# Patient Record
Sex: Male | Born: 1980 | Race: White | Hispanic: No | Smoking: Never smoker
Health system: Southern US, Community
[De-identification: ages and names within clinical notes are randomized; demographics above are authoritative.]

## PROBLEM LIST (undated history)

## (undated) HISTORY — PX: WISDOM TOOTH EXTRACTION: SHX21

---

## 2019-11-23 DIAGNOSIS — K219 Gastro-esophageal reflux disease without esophagitis: Secondary | ICD-10-CM | POA: Diagnosis not present

## 2019-11-23 DIAGNOSIS — R040 Epistaxis: Secondary | ICD-10-CM | POA: Diagnosis not present

## 2019-11-23 DIAGNOSIS — R042 Hemoptysis: Secondary | ICD-10-CM | POA: Diagnosis not present

## 2019-11-23 DIAGNOSIS — Z6836 Body mass index (BMI) 36.0-36.9, adult: Secondary | ICD-10-CM | POA: Diagnosis not present

## 2020-01-10 DIAGNOSIS — S76211A Strain of adductor muscle, fascia and tendon of right thigh, initial encounter: Secondary | ICD-10-CM | POA: Diagnosis not present

## 2020-01-10 DIAGNOSIS — Z6836 Body mass index (BMI) 36.0-36.9, adult: Secondary | ICD-10-CM | POA: Diagnosis not present

## 2020-01-17 DIAGNOSIS — X58XXXD Exposure to other specified factors, subsequent encounter: Secondary | ICD-10-CM | POA: Diagnosis not present

## 2020-01-17 DIAGNOSIS — M6281 Muscle weakness (generalized): Secondary | ICD-10-CM | POA: Diagnosis not present

## 2020-01-17 DIAGNOSIS — S76211D Strain of adductor muscle, fascia and tendon of right thigh, subsequent encounter: Secondary | ICD-10-CM | POA: Diagnosis not present

## 2020-01-17 DIAGNOSIS — M79604 Pain in right leg: Secondary | ICD-10-CM | POA: Diagnosis not present

## 2020-01-19 DIAGNOSIS — M79604 Pain in right leg: Secondary | ICD-10-CM | POA: Diagnosis not present

## 2020-01-19 DIAGNOSIS — M6281 Muscle weakness (generalized): Secondary | ICD-10-CM | POA: Diagnosis not present

## 2020-01-19 DIAGNOSIS — X58XXXD Exposure to other specified factors, subsequent encounter: Secondary | ICD-10-CM | POA: Diagnosis not present

## 2020-01-19 DIAGNOSIS — S76211D Strain of adductor muscle, fascia and tendon of right thigh, subsequent encounter: Secondary | ICD-10-CM | POA: Diagnosis not present

## 2020-01-24 DIAGNOSIS — X58XXXD Exposure to other specified factors, subsequent encounter: Secondary | ICD-10-CM | POA: Diagnosis not present

## 2020-01-24 DIAGNOSIS — M6281 Muscle weakness (generalized): Secondary | ICD-10-CM | POA: Diagnosis not present

## 2020-01-24 DIAGNOSIS — S76211D Strain of adductor muscle, fascia and tendon of right thigh, subsequent encounter: Secondary | ICD-10-CM | POA: Diagnosis not present

## 2020-01-24 DIAGNOSIS — M79604 Pain in right leg: Secondary | ICD-10-CM | POA: Diagnosis not present

## 2020-01-26 DIAGNOSIS — M6281 Muscle weakness (generalized): Secondary | ICD-10-CM | POA: Diagnosis not present

## 2020-01-26 DIAGNOSIS — X58XXXD Exposure to other specified factors, subsequent encounter: Secondary | ICD-10-CM | POA: Diagnosis not present

## 2020-01-26 DIAGNOSIS — M79604 Pain in right leg: Secondary | ICD-10-CM | POA: Diagnosis not present

## 2020-01-26 DIAGNOSIS — S76211D Strain of adductor muscle, fascia and tendon of right thigh, subsequent encounter: Secondary | ICD-10-CM | POA: Diagnosis not present

## 2020-01-31 DIAGNOSIS — X58XXXD Exposure to other specified factors, subsequent encounter: Secondary | ICD-10-CM | POA: Diagnosis not present

## 2020-01-31 DIAGNOSIS — M79604 Pain in right leg: Secondary | ICD-10-CM | POA: Diagnosis not present

## 2020-01-31 DIAGNOSIS — M6281 Muscle weakness (generalized): Secondary | ICD-10-CM | POA: Diagnosis not present

## 2020-01-31 DIAGNOSIS — S76211D Strain of adductor muscle, fascia and tendon of right thigh, subsequent encounter: Secondary | ICD-10-CM | POA: Diagnosis not present

## 2020-02-02 DIAGNOSIS — M79604 Pain in right leg: Secondary | ICD-10-CM | POA: Diagnosis not present

## 2020-02-02 DIAGNOSIS — S76211D Strain of adductor muscle, fascia and tendon of right thigh, subsequent encounter: Secondary | ICD-10-CM | POA: Diagnosis not present

## 2020-02-02 DIAGNOSIS — M6281 Muscle weakness (generalized): Secondary | ICD-10-CM | POA: Diagnosis not present

## 2020-02-02 DIAGNOSIS — X58XXXD Exposure to other specified factors, subsequent encounter: Secondary | ICD-10-CM | POA: Diagnosis not present

## 2020-02-04 DIAGNOSIS — S76011A Strain of muscle, fascia and tendon of right hip, initial encounter: Secondary | ICD-10-CM | POA: Diagnosis not present

## 2020-02-04 DIAGNOSIS — X58XXXA Exposure to other specified factors, initial encounter: Secondary | ICD-10-CM | POA: Diagnosis not present

## 2020-02-04 DIAGNOSIS — M545 Low back pain: Secondary | ICD-10-CM | POA: Diagnosis not present

## 2020-02-04 DIAGNOSIS — M79651 Pain in right thigh: Secondary | ICD-10-CM | POA: Diagnosis not present

## 2020-02-04 DIAGNOSIS — M25551 Pain in right hip: Secondary | ICD-10-CM | POA: Diagnosis not present

## 2020-02-07 DIAGNOSIS — M6281 Muscle weakness (generalized): Secondary | ICD-10-CM | POA: Diagnosis not present

## 2020-02-07 DIAGNOSIS — M79604 Pain in right leg: Secondary | ICD-10-CM | POA: Diagnosis not present

## 2020-02-07 DIAGNOSIS — S76211D Strain of adductor muscle, fascia and tendon of right thigh, subsequent encounter: Secondary | ICD-10-CM | POA: Diagnosis not present

## 2020-02-07 DIAGNOSIS — X58XXXD Exposure to other specified factors, subsequent encounter: Secondary | ICD-10-CM | POA: Diagnosis not present

## 2020-02-08 DIAGNOSIS — M545 Low back pain: Secondary | ICD-10-CM | POA: Diagnosis not present

## 2020-02-08 DIAGNOSIS — M4317 Spondylolisthesis, lumbosacral region: Secondary | ICD-10-CM | POA: Diagnosis not present

## 2020-02-08 DIAGNOSIS — M4726 Other spondylosis with radiculopathy, lumbar region: Secondary | ICD-10-CM | POA: Diagnosis not present

## 2020-02-15 DIAGNOSIS — M545 Low back pain: Secondary | ICD-10-CM | POA: Diagnosis not present

## 2020-02-15 DIAGNOSIS — M4316 Spondylolisthesis, lumbar region: Secondary | ICD-10-CM | POA: Diagnosis not present

## 2020-02-15 DIAGNOSIS — M4317 Spondylolisthesis, lumbosacral region: Secondary | ICD-10-CM | POA: Diagnosis not present

## 2020-02-15 DIAGNOSIS — M4807 Spinal stenosis, lumbosacral region: Secondary | ICD-10-CM | POA: Diagnosis not present

## 2020-02-22 DIAGNOSIS — I10 Essential (primary) hypertension: Secondary | ICD-10-CM | POA: Diagnosis not present

## 2020-02-22 DIAGNOSIS — M5136 Other intervertebral disc degeneration, lumbar region: Secondary | ICD-10-CM | POA: Diagnosis not present

## 2020-02-22 DIAGNOSIS — M5416 Radiculopathy, lumbar region: Secondary | ICD-10-CM | POA: Diagnosis not present

## 2020-02-22 DIAGNOSIS — M4317 Spondylolisthesis, lumbosacral region: Secondary | ICD-10-CM | POA: Diagnosis not present

## 2020-02-22 DIAGNOSIS — Z6837 Body mass index (BMI) 37.0-37.9, adult: Secondary | ICD-10-CM | POA: Diagnosis not present

## 2020-03-07 DIAGNOSIS — M5416 Radiculopathy, lumbar region: Secondary | ICD-10-CM | POA: Diagnosis not present

## 2020-03-07 DIAGNOSIS — M5136 Other intervertebral disc degeneration, lumbar region: Secondary | ICD-10-CM | POA: Diagnosis not present

## 2020-03-07 DIAGNOSIS — M4317 Spondylolisthesis, lumbosacral region: Secondary | ICD-10-CM | POA: Diagnosis not present

## 2020-03-21 DIAGNOSIS — M79661 Pain in right lower leg: Secondary | ICD-10-CM | POA: Diagnosis not present

## 2020-03-21 DIAGNOSIS — R102 Pelvic and perineal pain: Secondary | ICD-10-CM | POA: Diagnosis not present

## 2020-03-21 DIAGNOSIS — M79662 Pain in left lower leg: Secondary | ICD-10-CM | POA: Diagnosis not present

## 2020-03-21 DIAGNOSIS — R937 Abnormal findings on diagnostic imaging of other parts of musculoskeletal system: Secondary | ICD-10-CM | POA: Diagnosis not present

## 2020-03-21 DIAGNOSIS — M4317 Spondylolisthesis, lumbosacral region: Secondary | ICD-10-CM | POA: Diagnosis not present

## 2020-04-04 DIAGNOSIS — M5136 Other intervertebral disc degeneration, lumbar region: Secondary | ICD-10-CM | POA: Diagnosis not present

## 2020-04-20 DIAGNOSIS — M25551 Pain in right hip: Secondary | ICD-10-CM | POA: Diagnosis not present

## 2020-04-20 DIAGNOSIS — M5416 Radiculopathy, lumbar region: Secondary | ICD-10-CM | POA: Diagnosis not present

## 2020-04-20 DIAGNOSIS — M5136 Other intervertebral disc degeneration, lumbar region: Secondary | ICD-10-CM | POA: Diagnosis not present

## 2020-04-20 DIAGNOSIS — M545 Low back pain: Secondary | ICD-10-CM | POA: Diagnosis not present

## 2020-04-21 DIAGNOSIS — M5417 Radiculopathy, lumbosacral region: Secondary | ICD-10-CM | POA: Diagnosis not present

## 2020-04-21 DIAGNOSIS — R1031 Right lower quadrant pain: Secondary | ICD-10-CM | POA: Diagnosis not present

## 2020-04-21 DIAGNOSIS — Z6836 Body mass index (BMI) 36.0-36.9, adult: Secondary | ICD-10-CM | POA: Diagnosis not present

## 2020-04-26 DIAGNOSIS — M25551 Pain in right hip: Secondary | ICD-10-CM | POA: Diagnosis not present

## 2020-05-02 DIAGNOSIS — M5136 Other intervertebral disc degeneration, lumbar region: Secondary | ICD-10-CM | POA: Diagnosis not present

## 2020-05-09 DIAGNOSIS — R1031 Right lower quadrant pain: Secondary | ICD-10-CM | POA: Diagnosis not present

## 2020-05-23 ENCOUNTER — Other Ambulatory Visit: Payer: Self-pay | Admitting: Neurosurgery

## 2020-05-23 DIAGNOSIS — M4307 Spondylolysis, lumbosacral region: Secondary | ICD-10-CM | POA: Diagnosis not present

## 2020-06-08 ENCOUNTER — Other Ambulatory Visit: Payer: Self-pay | Admitting: Neurosurgery

## 2020-06-09 NOTE — Pre-Procedure Instructions (Signed)
Your procedure is scheduled on Wednesday, June 16th.  Report to [facility] Main Entrance "A" at 6:30 A.M., and check in at the Admitting office.  Call this number if you have problems the morning of surgery:  [phone]  Call [phone] if you have any questions prior to your surgery date Monday-Friday 8am-4pm    Remember:  Do not eat or drink after midnight the night before your surgery    Take these medicines the morning of surgery with A SIP OF WATER: NONE Albuterol inhaler-as needed for shortness of breath; please bring with you to the hospital.   As of today, STOP taking any Aspirin (unless otherwise instructed by your surgeon) and Aspirin containing products, Aleve, Naproxen, Ibuprofen, Motrin, Advil, Goody's, BC's, all herbal medications, fish oil, and all vitamins.                      Do not wear jewelry.            Do not wear lotions, powders, colognes, or deodorant.            Men may shave face and neck.            Do not bring valuables to the hospital.            [facility] is not responsible for any belongings or valuables.  Do NOT Smoke (Tobacco/Vaping) or drink Alcohol 24 hours prior to your procedure If you use a CPAP at night, you may bring all equipment for your overnight stay.   Contacts, glasses, dentures or bridgework may not be worn into surgery.      For patients admitted to the hospital, discharge time will be determined by your treatment team.   Patients discharged the day of surgery will not be allowed to drive home, and someone needs to stay with them for 24 hours.    Special instructions:   [place]- Preparing For Surgery  Before surgery, you can play an important role. Because skin is not sterile, your skin needs to be as free of germs as possible. You can reduce the number of germs on your skin by washing with CHG (chlorahexidine gluconate) Soap before surgery.  CHG is an antiseptic cleaner which kills germs and bonds with the skin to  continue killing germs even after washing.    Oral Hygiene is also important to reduce your risk of infection.  Remember - BRUSH YOUR TEETH THE MORNING OF SURGERY WITH YOUR REGULAR TOOTHPASTE  Please do not use if you have an allergy to CHG or antibacterial soaps. If your skin becomes reddened/irritated stop using the CHG.  Do not shave (including legs and underarms) for at least 48 hours prior to first CHG shower. It is OK to shave your face.  Please follow these instructions carefully.   1. Shower the NIGHT BEFORE SURGERY and the MORNING OF SURGERY with CHG Soap.   2. If you chose to wash your hair, wash your hair first as usual with your normal shampoo.  3. After you shampoo, rinse your hair and body thoroughly to remove the shampoo.  4. Use CHG as you would any other liquid soap. You can apply CHG directly to the skin and wash gently with a scrungie or a clean washcloth.   5. Apply the CHG Soap to your body ONLY FROM THE NECK DOWN.  Do not use on open wounds or open sores. Avoid contact with your eyes, ears, mouth and genitals (  private parts). Wash Face and genitals (private parts)  with your normal soap.   6. Wash thoroughly, paying special attention to the area where your surgery will be performed.  7. Thoroughly rinse your body with warm water from the neck down.  8. DO NOT shower/wash with your normal soap after using and rinsing off the CHG Soap.  9. Pat yourself dry with a CLEAN TOWEL.  10. Wear CLEAN PAJAMAS to bed the night before surgery, wear comfortable clothes the morning of surgery  11. Place CLEAN SHEETS on your bed the night of your first shower and DO NOT SLEEP WITH PETS.   Day of Surgery:   Do not apply any deodorants/lotions.  Please wear clean clothes to the hospital/surgery center.   Remember to brush your teeth WITH YOUR REGULAR TOOTHPASTE.   Please read over the following fact sheets that you were given.

## 2020-06-12 ENCOUNTER — Other Ambulatory Visit: Payer: Self-pay

## 2020-06-12 ENCOUNTER — Encounter (HOSPITAL_COMMUNITY): Payer: Self-pay

## 2020-06-12 ENCOUNTER — Encounter (HOSPITAL_COMMUNITY)
Admission: RE | Admit: 2020-06-12 | Discharge: 2020-06-12 | Disposition: A | Discharge status: Home / Self Care | Payer: BC Managed Care – PPO | Source: Ambulatory Visit | Attending: Neurosurgery | Admitting: Neurosurgery

## 2020-06-12 ENCOUNTER — Other Ambulatory Visit (HOSPITAL_COMMUNITY)
Admission: RE | Admit: 2020-06-12 | Discharge: 2020-06-12 | Disposition: A | Discharge status: Home / Self Care | Payer: BC Managed Care – PPO | Source: Ambulatory Visit | Attending: Neurosurgery | Admitting: Neurosurgery

## 2020-06-12 DIAGNOSIS — Z01812 Encounter for preprocedural laboratory examination: Secondary | ICD-10-CM | POA: Insufficient documentation

## 2020-06-12 DIAGNOSIS — M4326 Fusion of spine, lumbar region: Secondary | ICD-10-CM | POA: Diagnosis not present

## 2020-06-12 DIAGNOSIS — Z9889 Other specified postprocedural states: Secondary | ICD-10-CM | POA: Diagnosis not present

## 2020-06-12 DIAGNOSIS — M4807 Spinal stenosis, lumbosacral region: Secondary | ICD-10-CM | POA: Diagnosis not present

## 2020-06-12 DIAGNOSIS — M4317 Spondylolisthesis, lumbosacral region: Secondary | ICD-10-CM | POA: Diagnosis not present

## 2020-06-12 DIAGNOSIS — M5417 Radiculopathy, lumbosacral region: Secondary | ICD-10-CM | POA: Diagnosis not present

## 2020-06-12 DIAGNOSIS — M5117 Intervertebral disc disorders with radiculopathy, lumbosacral region: Secondary | ICD-10-CM | POA: Diagnosis not present

## 2020-06-12 DIAGNOSIS — Z886 Allergy status to analgesic agent status: Secondary | ICD-10-CM | POA: Diagnosis not present

## 2020-06-12 DIAGNOSIS — M4316 Spondylolisthesis, lumbar region: Secondary | ICD-10-CM | POA: Diagnosis not present

## 2020-06-12 DIAGNOSIS — M4727 Other spondylosis with radiculopathy, lumbosacral region: Secondary | ICD-10-CM | POA: Diagnosis not present

## 2020-06-12 DIAGNOSIS — Z20822 Contact with and (suspected) exposure to covid-19: Secondary | ICD-10-CM | POA: Diagnosis not present

## 2020-06-12 LAB — CBC
HCT: 44.6 % (ref 39.0–52.0)
Hemoglobin: 14.4 g/dL (ref 13.0–17.0)
MCH: 29.6 pg (ref 26.0–34.0)
MCHC: 32.3 g/dL (ref 30.0–36.0)
MCV: 91.8 fL (ref 80.0–100.0)
Platelets: 221 10*3/uL (ref 150–400)
RBC: 4.86 MIL/uL (ref 4.22–5.81)
RDW: 12.6 % (ref 11.5–15.5)
WBC: 4.4 10*3/uL (ref 4.0–10.5)
nRBC: 0 % (ref 0.0–0.2)

## 2020-06-12 LAB — SARS CORONAVIRUS 2 (TAT 6-24 HRS): SARS Coronavirus 2: NEGATIVE

## 2020-06-12 LAB — SURGICAL PCR SCREEN
MRSA, PCR: NEGATIVE
Staphylococcus aureus: NEGATIVE

## 2020-06-12 LAB — TYPE AND SCREEN
ABO/RH(D): A POS
Antibody Screen: NEGATIVE

## 2020-06-12 LAB — ABO/RH: ABO/RH(D): A POS

## 2020-06-12 NOTE — Progress Notes (Signed)
PCP - Dr. Welton Flakes Cardiologist - pt denies    Chest x-ray - n/a EKG - n/a Stress Test - pt denies ECHO - pt denies Cardiac Cath - pt denies    Blood Thinner Instructions: n/a Aspirin Instructions:n/a  ERAS Protcol - n/a PRE-SURGERY Ensure or G2- n/a  COVID TEST- 06/12/20  Coronavirus Screening  Have you experienced the following symptoms:  Cough yes/no: No Fever (>100.27F)  yes/no: No Runny nose yes/no: No Sore throat yes/no: No Difficulty breathing/shortness of breath  yes/no: No  Have you or a family member traveled in the last 14 days and where? yes/no: No   If the patient indicates "YES" to the above questions, their PAT will be rescheduled to limit the exposure to others and, the surgeon will be notified. THE PATIENT WILL NEED TO BE ASYMPTOMATIC FOR 14 DAYS.   If the patient is not experiencing any of these symptoms, the PAT nurse will instruct them to NOT bring anyone with them to their appointment since they may have these symptoms or traveled as well.   Please remind your patients and families that hospital visitation restrictions are in effect and the importance of the restrictions.     Anesthesia review: n/a  Patient denies shortness of breath, fever, cough and chest pain at PAT appointment   All instructions explained to the patient, with a verbal understanding of the material. Patient agrees to go over the instructions while at home for a better understanding. Patient also instructed to self quarantine after being tested for COVID-19. The opportunity to ask questions was provided.

## 2020-06-13 MED ORDER — DEXTROSE 5 % IV SOLN
3.0000 g | INTRAVENOUS | Status: AC
  Administered 2020-06-14: 3 g via INTRAVENOUS
  Filled 2020-06-13: qty 3

## 2020-06-14 ENCOUNTER — Inpatient Hospital Stay (HOSPITAL_COMMUNITY): Payer: BC Managed Care – PPO

## 2020-06-14 ENCOUNTER — Inpatient Hospital Stay (HOSPITAL_COMMUNITY)
Admission: RE | Admit: 2020-06-14 | Discharge: 2020-06-15 | DRG: 455 | Disposition: A | Discharge status: Home / Self Care | Payer: BC Managed Care – PPO | Attending: Neurosurgery | Admitting: Neurosurgery

## 2020-06-14 ENCOUNTER — Inpatient Hospital Stay (HOSPITAL_COMMUNITY): Payer: BC Managed Care – PPO | Admitting: Anesthesiology

## 2020-06-14 ENCOUNTER — Inpatient Hospital Stay (HOSPITAL_COMMUNITY)
Admission: RE | Disposition: A | Discharge status: Home / Self Care | Payer: Self-pay | Source: Home / Self Care | Attending: Neurosurgery

## 2020-06-14 ENCOUNTER — Encounter (HOSPITAL_COMMUNITY): Payer: Self-pay | Admitting: Neurosurgery

## 2020-06-14 ENCOUNTER — Other Ambulatory Visit: Payer: Self-pay

## 2020-06-14 DIAGNOSIS — M4727 Other spondylosis with radiculopathy, lumbosacral region: Secondary | ICD-10-CM | POA: Diagnosis not present

## 2020-06-14 DIAGNOSIS — M5117 Intervertebral disc disorders with radiculopathy, lumbosacral region: Secondary | ICD-10-CM | POA: Diagnosis not present

## 2020-06-14 DIAGNOSIS — Z886 Allergy status to analgesic agent status: Secondary | ICD-10-CM

## 2020-06-14 DIAGNOSIS — M4807 Spinal stenosis, lumbosacral region: Secondary | ICD-10-CM | POA: Diagnosis present

## 2020-06-14 DIAGNOSIS — Z9889 Other specified postprocedural states: Secondary | ICD-10-CM | POA: Diagnosis not present

## 2020-06-14 DIAGNOSIS — Z20822 Contact with and (suspected) exposure to covid-19: Secondary | ICD-10-CM | POA: Diagnosis present

## 2020-06-14 DIAGNOSIS — M5417 Radiculopathy, lumbosacral region: Secondary | ICD-10-CM | POA: Diagnosis present

## 2020-06-14 DIAGNOSIS — M4326 Fusion of spine, lumbar region: Secondary | ICD-10-CM | POA: Diagnosis not present

## 2020-06-14 DIAGNOSIS — M4316 Spondylolisthesis, lumbar region: Secondary | ICD-10-CM | POA: Diagnosis present

## 2020-06-14 DIAGNOSIS — M4317 Spondylolisthesis, lumbosacral region: Secondary | ICD-10-CM | POA: Diagnosis not present

## 2020-06-14 DIAGNOSIS — Z419 Encounter for procedure for purposes other than remedying health state, unspecified: Secondary | ICD-10-CM

## 2020-06-14 SURGERY — POSTERIOR LUMBAR FUSION 1 LEVEL
Anesthesia: General | Site: Spine Lumbar

## 2020-06-14 MED ORDER — PHENOL 1.4 % MT LIQD: 1.0000 | OROMUCOSAL | Status: DC | PRN

## 2020-06-14 MED ORDER — THROMBIN 5000 UNITS EX SOLR
OROMUCOSAL | Status: DC | PRN
  Administered 2020-06-14 (×3): 5 mL via TOPICAL

## 2020-06-14 MED ORDER — MENTHOL 3 MG MT LOZG: 1.0000 | LOZENGE | OROMUCOSAL | Status: DC | PRN

## 2020-06-14 MED ORDER — OXYCODONE HCL 5 MG PO TABS: 5.0000 mg | ORAL_TABLET | ORAL | Status: DC | PRN

## 2020-06-14 MED ORDER — FENTANYL CITRATE (PF) 250 MCG/5ML IJ SOLN
INTRAMUSCULAR | Status: AC
  Filled 2020-06-14: qty 10

## 2020-06-14 MED ORDER — MIDAZOLAM HCL 2 MG/2ML IJ SOLN
INTRAMUSCULAR | Status: AC
  Filled 2020-06-14: qty 2

## 2020-06-14 MED ORDER — SODIUM CHLORIDE 0.9% FLUSH: 3.0000 mL | INTRAVENOUS | Status: DC | PRN

## 2020-06-14 MED ORDER — BACITRACIN ZINC 500 UNIT/GM EX OINT
TOPICAL_OINTMENT | CUTANEOUS | Status: DC | PRN
  Administered 2020-06-14: 1 via TOPICAL

## 2020-06-14 MED ORDER — GLYCOPYRROLATE 0.2 MG/ML IJ SOLN
INTRAMUSCULAR | Status: DC | PRN
  Administered 2020-06-14: .4 mg via INTRAVENOUS

## 2020-06-14 MED ORDER — SODIUM CHLORIDE 0.9 % IV SOLN: 250.0000 mL | INTRAVENOUS | Status: DC

## 2020-06-14 MED ORDER — BUPIVACAINE-EPINEPHRINE 0.5% -1:200000 IJ SOLN
INTRAMUSCULAR | Status: AC
  Filled 2020-06-14: qty 1

## 2020-06-14 MED ORDER — CHLORHEXIDINE GLUCONATE CLOTH 2 % EX PADS: 6.0000 | MEDICATED_PAD | Freq: Once | CUTANEOUS | Status: DC

## 2020-06-14 MED ORDER — PROPOFOL 10 MG/ML IV BOLUS
INTRAVENOUS | Status: DC | PRN
  Administered 2020-06-14: 120 mg via INTRAVENOUS

## 2020-06-14 MED ORDER — SODIUM CHLORIDE 0.9% FLUSH
3.0000 mL | Freq: Two times a day (BID) | INTRAVENOUS | Status: DC
  Administered 2020-06-14: 3 mL via INTRAVENOUS

## 2020-06-14 MED ORDER — BACITRACIN ZINC 500 UNIT/GM EX OINT
TOPICAL_OINTMENT | CUTANEOUS | Status: AC
  Filled 2020-06-14: qty 28.35

## 2020-06-14 MED ORDER — THROMBIN 5000 UNITS EX SOLR
CUTANEOUS | Status: AC
  Filled 2020-06-14: qty 5000

## 2020-06-14 MED ORDER — HYDROMORPHONE HCL 1 MG/ML IJ SOLN
INTRAMUSCULAR | Status: DC | PRN
  Administered 2020-06-14: .5 mg via INTRAVENOUS

## 2020-06-14 MED ORDER — ACETAMINOPHEN 650 MG RE SUPP: 650.0000 mg | RECTAL | Status: DC | PRN

## 2020-06-14 MED ORDER — PROPOFOL 10 MG/ML IV BOLUS
INTRAVENOUS | Status: AC
  Filled 2020-06-14: qty 20

## 2020-06-14 MED ORDER — LACTATED RINGERS IV SOLN
INTRAVENOUS | Status: DC | PRN
Start: 2020-06-14 — End: 2020-06-14

## 2020-06-14 MED ORDER — ONDANSETRON HCL 4 MG/2ML IJ SOLN
4.0000 mg | Freq: Four times a day (QID) | INTRAMUSCULAR | Status: DC | PRN
  Administered 2020-06-14: 4 mg via INTRAVENOUS

## 2020-06-14 MED ORDER — BISACODYL 10 MG RE SUPP: 10.0000 mg | Freq: Every day | RECTAL | Status: DC | PRN

## 2020-06-14 MED ORDER — BUPIVACAINE-EPINEPHRINE (PF) 0.5% -1:200000 IJ SOLN
INTRAMUSCULAR | Status: DC | PRN
  Administered 2020-06-14: 10 mL

## 2020-06-14 MED ORDER — ACETAMINOPHEN 500 MG PO TABS
1000.0000 mg | ORAL_TABLET | Freq: Four times a day (QID) | ORAL | Status: AC
  Administered 2020-06-14 – 2020-06-15 (×3): 1000 mg via ORAL
  Filled 2020-06-14 (×3): qty 2

## 2020-06-14 MED ORDER — BUPIVACAINE LIPOSOME 1.3 % IJ SUSP
20.0000 mL | Freq: Once | INTRAMUSCULAR | Status: DC
  Filled 2020-06-14: qty 20

## 2020-06-14 MED ORDER — ACETAMINOPHEN 325 MG PO TABS: 650.0000 mg | ORAL_TABLET | ORAL | Status: DC | PRN

## 2020-06-14 MED ORDER — BUPIVACAINE LIPOSOME 1.3 % IJ SUSP
INTRAMUSCULAR | Status: DC | PRN
  Administered 2020-06-14: 20 mL

## 2020-06-14 MED ORDER — DOCUSATE SODIUM 100 MG PO CAPS
100.0000 mg | ORAL_CAPSULE | Freq: Two times a day (BID) | ORAL | Status: DC
  Administered 2020-06-14: 100 mg via ORAL
  Filled 2020-06-14: qty 1

## 2020-06-14 MED ORDER — ROCURONIUM BROMIDE 10 MG/ML (PF) SYRINGE
PREFILLED_SYRINGE | INTRAVENOUS | Status: DC | PRN
  Administered 2020-06-14: 100 mg via INTRAVENOUS
  Administered 2020-06-14: 10 mg via INTRAVENOUS
  Administered 2020-06-14: 20 mg via INTRAVENOUS
  Administered 2020-06-14: 30 mg via INTRAVENOUS

## 2020-06-14 MED ORDER — EPHEDRINE SULFATE-NACL 50-0.9 MG/10ML-% IV SOSY
PREFILLED_SYRINGE | INTRAVENOUS | Status: DC | PRN
  Administered 2020-06-14: 5 mg via INTRAVENOUS

## 2020-06-14 MED ORDER — 0.9 % SODIUM CHLORIDE (POUR BTL) OPTIME
TOPICAL | Status: DC | PRN
  Administered 2020-06-14: 1000 mL

## 2020-06-14 MED ORDER — FENTANYL CITRATE (PF) 250 MCG/5ML IJ SOLN
INTRAMUSCULAR | Status: DC | PRN
  Administered 2020-06-14: 50 ug via INTRAVENOUS
  Administered 2020-06-14: 200 ug via INTRAVENOUS
  Administered 2020-06-14: 50 ug via INTRAVENOUS
  Administered 2020-06-14 (×2): 100 ug via INTRAVENOUS

## 2020-06-14 MED ORDER — ONDANSETRON HCL 4 MG/2ML IJ SOLN
INTRAMUSCULAR | Status: AC
  Filled 2020-06-14: qty 2

## 2020-06-14 MED ORDER — DEXAMETHASONE SODIUM PHOSPHATE 10 MG/ML IJ SOLN
INTRAMUSCULAR | Status: DC | PRN
  Administered 2020-06-14: 10 mg via INTRAVENOUS

## 2020-06-14 MED ORDER — MIDAZOLAM HCL 5 MG/5ML IJ SOLN
INTRAMUSCULAR | Status: DC | PRN
  Administered 2020-06-14: 2 mg via INTRAVENOUS

## 2020-06-14 MED ORDER — MORPHINE SULFATE (PF) 4 MG/ML IV SOLN
4.0000 mg | INTRAVENOUS | Status: DC | PRN
  Administered 2020-06-14: 4 mg via INTRAVENOUS
  Filled 2020-06-14: qty 1

## 2020-06-14 MED ORDER — CYCLOBENZAPRINE HCL 10 MG PO TABS: 10.0000 mg | ORAL_TABLET | Freq: Three times a day (TID) | ORAL | Status: DC | PRN

## 2020-06-14 MED ORDER — ONDANSETRON HCL 4 MG/2ML IJ SOLN
INTRAMUSCULAR | Status: DC | PRN
  Administered 2020-06-14: 4 mg via INTRAVENOUS

## 2020-06-14 MED ORDER — ALBUTEROL SULFATE (2.5 MG/3ML) 0.083% IN NEBU: 2.5000 mg | INHALATION_SOLUTION | Freq: Four times a day (QID) | RESPIRATORY_TRACT | Status: DC | PRN

## 2020-06-14 MED ORDER — PHENYLEPHRINE HCL-NACL 10-0.9 MG/250ML-% IV SOLN
INTRAVENOUS | Status: DC | PRN
  Administered 2020-06-14: 300 ug/min via INTRAVENOUS

## 2020-06-14 MED ORDER — PHENYLEPHRINE 40 MCG/ML (10ML) SYRINGE FOR IV PUSH (FOR BLOOD PRESSURE SUPPORT)
PREFILLED_SYRINGE | INTRAVENOUS | Status: DC | PRN
  Administered 2020-06-14: 80 ug via INTRAVENOUS
  Administered 2020-06-14 (×2): 40 ug via INTRAVENOUS

## 2020-06-14 MED ORDER — CHLORHEXIDINE GLUCONATE 0.12 % MT SOLN: 15.0000 mL | Freq: Once | OROMUCOSAL | Status: AC

## 2020-06-14 MED ORDER — LIDOCAINE 2% (20 MG/ML) 5 ML SYRINGE
INTRAMUSCULAR | Status: DC | PRN
  Administered 2020-06-14: 100 mg via INTRAVENOUS

## 2020-06-14 MED ORDER — CEFAZOLIN SODIUM-DEXTROSE 2-4 GM/100ML-% IV SOLN
2.0000 g | Freq: Three times a day (TID) | INTRAVENOUS | Status: AC
  Administered 2020-06-14 (×2): 2 g via INTRAVENOUS
  Filled 2020-06-14 (×2): qty 100

## 2020-06-14 MED ORDER — ONDANSETRON HCL 4 MG PO TABS: 4.0000 mg | ORAL_TABLET | Freq: Four times a day (QID) | ORAL | Status: DC | PRN

## 2020-06-14 MED ORDER — ORAL CARE MOUTH RINSE: 15.0000 mL | Freq: Once | OROMUCOSAL | Status: AC

## 2020-06-14 MED ORDER — CHLORHEXIDINE GLUCONATE 0.12 % MT SOLN
OROMUCOSAL | Status: AC
  Administered 2020-06-14: 15 mL via OROMUCOSAL
  Filled 2020-06-14: qty 15

## 2020-06-14 MED ORDER — OXYCODONE HCL 5 MG PO TABS: 10.0000 mg | ORAL_TABLET | ORAL | Status: DC | PRN

## 2020-06-14 MED ORDER — SODIUM CHLORIDE 0.9 % IV SOLN
INTRAVENOUS | Status: DC | PRN
  Administered 2020-06-14: 500 mL

## 2020-06-14 SURGICAL SUPPLY — 77 items
BAG DECANTER FOR FLEXI CONT (MISCELLANEOUS) ×3 IMPLANT
BENZOIN TINCTURE PRP APPL 2/3 (GAUZE/BANDAGES/DRESSINGS) ×3 IMPLANT
BLADE CLIPPER SURG (BLADE) ×3 IMPLANT
BUR MATCHSTICK NEURO 3.0 LAGG (BURR) ×3 IMPLANT
BUR PRECISION FLUTE 6.0 (BURR) ×3 IMPLANT
CAGE ALTERA 10X31MM-10-14-15 (Cage) ×1 IMPLANT
CAGE ALTERA 10X31X10-14 15D (Cage) ×2 IMPLANT
CANISTER SUCT 3000ML PPV (MISCELLANEOUS) ×3 IMPLANT
CAP LOCK DLX THRD (Cap) ×12 IMPLANT
CARTRIDGE OIL MAESTRO DRILL (MISCELLANEOUS) ×1 IMPLANT
CLOSURE WOUND 1/2 X4 (GAUZE/BANDAGES/DRESSINGS) ×1
CNTNR URN SCR LID CUP LEK RST (MISCELLANEOUS) ×1 IMPLANT
CONT SPEC 4OZ STRL OR WHT (MISCELLANEOUS) ×2
COVER BACK TABLE 60X90IN (DRAPES) ×3 IMPLANT
COVER WAND RF STERILE (DRAPES) IMPLANT
DECANTER SPIKE VIAL GLASS SM (MISCELLANEOUS) ×3 IMPLANT
DIFFUSER DRILL AIR PNEUMATIC (MISCELLANEOUS) ×3 IMPLANT
DRAPE C-ARM 42X72 X-RAY (DRAPES) ×6 IMPLANT
DRAPE HALF SHEET 40X57 (DRAPES) ×3 IMPLANT
DRAPE LAPAROTOMY 100X72X124 (DRAPES) ×3 IMPLANT
DRAPE SURG 17X23 STRL (DRAPES) ×12 IMPLANT
DRSG OPSITE POSTOP 4X6 (GAUZE/BANDAGES/DRESSINGS) ×3 IMPLANT
ELECT BLADE 4.0 EZ CLEAN MEGAD (MISCELLANEOUS) ×3
ELECT REM PT RETURN 9FT ADLT (ELECTROSURGICAL) ×3
ELECTRODE BLDE 4.0 EZ CLN MEGD (MISCELLANEOUS) ×1 IMPLANT
ELECTRODE REM PT RTRN 9FT ADLT (ELECTROSURGICAL) ×1 IMPLANT
EVACUATOR 1/8 PVC DRAIN (DRAIN) IMPLANT
GAUZE 4X4 16PLY RFD (DISPOSABLE) ×3 IMPLANT
GAUZE SPONGE 4X4 12PLY STRL (GAUZE/BANDAGES/DRESSINGS) IMPLANT
GLOVE BIO SURGEON STRL SZ 6.5 (GLOVE) ×4 IMPLANT
GLOVE BIO SURGEON STRL SZ7 (GLOVE) ×6 IMPLANT
GLOVE BIO SURGEON STRL SZ8 (GLOVE) ×6 IMPLANT
GLOVE BIO SURGEON STRL SZ8.5 (GLOVE) ×6 IMPLANT
GLOVE BIO SURGEONS STRL SZ 6.5 (GLOVE) ×2
GLOVE BIOGEL PI IND STRL 6.5 (GLOVE) ×1 IMPLANT
GLOVE BIOGEL PI IND STRL 7.5 (GLOVE) ×2 IMPLANT
GLOVE BIOGEL PI INDICATOR 6.5 (GLOVE) ×2
GLOVE BIOGEL PI INDICATOR 7.5 (GLOVE) ×4
GLOVE EXAM NITRILE XL STR (GLOVE) IMPLANT
GLOVE SS BIOGEL STRL SZ 7 (GLOVE) ×4 IMPLANT
GLOVE SUPERSENSE BIOGEL SZ 7 (GLOVE) ×8
GOWN STRL REUS W/ TWL LRG LVL3 (GOWN DISPOSABLE) ×1 IMPLANT
GOWN STRL REUS W/ TWL XL LVL3 (GOWN DISPOSABLE) ×4 IMPLANT
GOWN STRL REUS W/TWL 2XL LVL3 (GOWN DISPOSABLE) IMPLANT
GOWN STRL REUS W/TWL LRG LVL3 (GOWN DISPOSABLE) ×2
GOWN STRL REUS W/TWL XL LVL3 (GOWN DISPOSABLE) ×8
HEMOSTAT POWDER KIT SURGIFOAM (HEMOSTASIS) ×9 IMPLANT
KIT BASIN OR (CUSTOM PROCEDURE TRAY) ×3 IMPLANT
KIT TURNOVER KIT B (KITS) ×3 IMPLANT
MILL MEDIUM DISP (BLADE) IMPLANT
NEEDLE HYPO 21X1.5 SAFETY (NEEDLE) ×3 IMPLANT
NEEDLE HYPO 22GX1.5 SAFETY (NEEDLE) ×3 IMPLANT
NS IRRIG 1000ML POUR BTL (IV SOLUTION) ×3 IMPLANT
OIL CARTRIDGE MAESTRO DRILL (MISCELLANEOUS) ×3
PACK LAMINECTOMY NEURO (CUSTOM PROCEDURE TRAY) ×3 IMPLANT
PAD ARMBOARD 7.5X6 YLW CONV (MISCELLANEOUS) ×15 IMPLANT
PATTIES SURGICAL .5 X.5 (GAUZE/BANDAGES/DRESSINGS) ×3 IMPLANT
PATTIES SURGICAL .5 X1 (DISPOSABLE) IMPLANT
PATTIES SURGICAL 1X1 (DISPOSABLE) ×6 IMPLANT
PUTTY DBM 10CC CALC GRAN (Putty) ×3 IMPLANT
ROD CURVED TI 6.35X35 (Rod) ×6 IMPLANT
SCREW PA CREO DLX 6.5X50 (Screw) ×3 IMPLANT
SCREW PA CREO DLX 6.5X55 (Screw) ×3 IMPLANT
SCREW PA DLX CREO 7.5X40 (Screw) ×6 IMPLANT
SLEEVE SURGICAL STRL (SLEEVE) IMPLANT
SPONGE LAP 4X18 RFD (DISPOSABLE) IMPLANT
SPONGE NEURO XRAY DETECT 1X3 (DISPOSABLE) ×3 IMPLANT
SPONGE SURGIFOAM ABS GEL 100 (HEMOSTASIS) IMPLANT
STRIP CLOSURE SKIN 1/2X4 (GAUZE/BANDAGES/DRESSINGS) ×2 IMPLANT
SUT VIC AB 1 CT1 18XBRD ANBCTR (SUTURE) ×2 IMPLANT
SUT VIC AB 1 CT1 8-18 (SUTURE) ×4
SUT VIC AB 2-0 CP2 18 (SUTURE) ×6 IMPLANT
SYR 20ML LL LF (SYRINGE) ×3 IMPLANT
TOWEL GREEN STERILE (TOWEL DISPOSABLE) ×3 IMPLANT
TOWEL GREEN STERILE FF (TOWEL DISPOSABLE) ×3 IMPLANT
TRAY FOLEY MTR SLVR 16FR STAT (SET/KITS/TRAYS/PACK) ×3 IMPLANT
WATER STERILE IRR 1000ML POUR (IV SOLUTION) ×3 IMPLANT

## 2020-06-14 NOTE — Anesthesia Postprocedure Evaluation (Signed)
Anesthesia Post Note  Patient: Justin Schmitt  Procedure(s) Performed: POSTERIOR LUMBAR INTERBODY FUSION, INTERBODY PROSTHESIS, POSTERIOR INSTRUMENTATION, LUMBAR FIVE- SACRAL ONE (N/A Spine Lumbar)     Patient location during evaluation: PACU Anesthesia Type: General Level of consciousness: awake and alert Pain management: pain level controlled Vital Signs Assessment: post-procedure vital signs reviewed and stable Respiratory status: spontaneous breathing, nonlabored ventilation, respiratory function stable and patient connected to nasal cannula oxygen Cardiovascular status: blood pressure returned to baseline and stable Postop Assessment: no apparent nausea or vomiting Anesthetic complications: no   No complications documented.  Last Vitals:  Vitals:   06/14/20 1345 06/14/20 1606  BP: 111/72 131/74  Pulse: 74 76  Resp: 16 18  Temp:  36.8 C  SpO2: 99% 98%    Last Pain:  Vitals:   06/14/20 1606  TempSrc: Oral  PainSc:                  Brenlyn Beshara DAVID

## 2020-06-14 NOTE — H&P (Signed)
Subjective: The patient is a 39 year old white male who has complained of back and right greater left leg pain consistent with lumbosacral radiculopathy.  He has failed medical management.  He was worked up with a lumbar MRI and lumbar x-rays which demonstrated an L5-S1 spondylolisthesis, spinal stenosis, with spondylolysis.  I discussed the various treatment options.  He has decided to proceed with surgery.  History reviewed. No pertinent past medical history.  Past Surgical History:  Procedure Laterality Date  . WISDOM TOOTH EXTRACTION     per pt >20 years ago    Allergies  Allergen Reactions  . Nsaids Swelling    Eyelid swelling    Social History   Tobacco Use  . Smoking status: Never Smoker  . Smokeless tobacco: Never Used  Substance Use Topics  . Alcohol use: Yes    Alcohol/week: 1.0 standard drink    Types: 1 Cans of beer per week    History reviewed. No pertinent family history. Prior to Admission medications   Medication Sig Start Date End Date Taking? Authorizing Provider  albuterol (VENTOLIN HFA) 108 (90 Base) MCG/ACT inhaler Inhale 1-2 puffs into the lungs every 6 (six) hours as needed for wheezing or shortness of breath.    [provider]     Review of Systems  Positive ROS: As above  All other systems have been reviewed and were otherwise negative with the exception of those mentioned in the HPI and as above.  Objective: Vital signs in last 24 hours: Temp:  [97.5 F (36.4 C)] 97.5 F (36.4 C) (06/16 0637) Pulse Rate:  [57] 57 (06/16 0637) Resp:  [17] 17 (06/16 0637) BP: (129)/(75) 129/75 (06/16 0637) SpO2:  [100 %] 100 % (06/16 0637) Weight:  [124.7 kg] 124.7 kg (06/16 0637) Estimated body mass index is 37.3 kg/m as calculated from the following:   Height as of this encounter: 6' (1.829 m).   Weight as of this encounter: 124.7 kg.   General Appearance: Alert Head: Normocephalic, without obvious abnormality, atraumatic Eyes: PERRL,  conjunctiva/corneas clear, EOM's intact,    Ears: Normal  Throat: Normal  Neck: Supple, Back: unremarkable Lungs: Clear to auscultation bilaterally, respirations unlabored Heart: Regular rate and rhythm, no murmur, rub or gallop Abdomen: Soft, non-tender Extremities: Extremities normal, atraumatic, no cyanosis or edema Skin: unremarkable  NEUROLOGIC:   Mental status: alert and oriented,Motor Exam - grossly normal Sensory Exam - grossly normal Reflexes:  Coordination - grossly normal Gait - grossly normal Balance - grossly normal Cranial Nerves: I: smell Not tested  II: visual acuity  OS: Normal  OD: Normal   II: visual fields Full to confrontation  II: pupils Equal, round, reactive to light  III,VII: ptosis None  III,IV,VI: extraocular muscles  Full ROM  V: mastication Normal  V: facial light touch sensation  Normal  V,VII: corneal reflex  Present  VII: facial muscle function - upper  Normal  VII: facial muscle function - lower Normal  VIII: hearing Not tested  IX: soft palate elevation  Normal  IX,X: gag reflex Present  XI: trapezius strength  5/5  XI: sternocleidomastoid strength 5/5  XI: neck flexion strength  5/5  XII: tongue strength  Normal    Data Review Lab Results  Component Value Date   WBC 4.4 06/12/2020   HGB 14.4 06/12/2020   HCT 44.6 06/12/2020   MCV 91.8 06/12/2020   PLT 221 06/12/2020   No results found for: NA, K, CL, CO2, BUN, CREATININE, GLUCOSE No results found for:  INR, PROTIME  Assessment/Plan: L5-S1 spondylolisthesis, L5 spondylolysis, foraminal stenosis, lumbosacral radiculopathy: I have discussed the situation with the patient.  I have reviewed his imaging studies with him and pointed out the abnormalities.  We have discussed the various treatment options including surgery.  I have described the surgical ER procedure a L5-S1 Gill procedure, decompression, instrumentation and fusion.  I have shown him surgical models.  I have given him a  surgical pamphlet.  We have discussed the risk, benefits, alternatives, expected postoperative course, and likelihood of achieving our goals with surgery.  I have answered all his questions.  He has decided to proceed with surgery.   Ophelia Charter 06/14/2020 8:24 AM

## 2020-06-14 NOTE — Progress Notes (Signed)
   Providing Compassionate, Quality Care - Together   Subjective: Patient reports no pain. He feels "pressure" in his back.  Objective: Vital signs in last 24 hours: Temp:  [97.2 F (36.2 C)-97.5 F (36.4 C)] 97.2 F (36.2 C) (06/16 1255) Pulse Rate:  [57] 57 (06/16 0637) Resp:  [12-17] 12 (06/16 1255) BP: (127-129)/(68-75) 127/68 (06/16 1255) SpO2:  [100 %] 100 % (06/16 1255) Weight:  [124.7 kg] 124.7 kg (06/16 0637)  Intake/Output from previous day: No intake/output data recorded. Intake/Output this shift: Total I/O In: 2000 [I.V.:2000] Out: 575 [Urine:425; Blood:150]  Alert and oriented x 4 PERRLA CN II-XII grossly intact MAE, Strength and sensation intact Incision is covered with Honeycomb dressing and Steri Strips; Dressing is clean, dry, and intact  Lab Results: Recent Labs    06/12/20 0837  WBC 4.4  HGB 14.4  HCT 44.6  PLT 221   BMET No results for input(s): NA, K, CL, CO2, GLUCOSE, BUN, CREATININE, CALCIUM in the last 72 hours.  Studies/Results: DG Lumbar Spine 2-3 Views  Result Date: 06/14/2020 CLINICAL DATA:  L5-S1 PLIF EXAM: LUMBAR SPINE - 2-3 VIEW; DG C-ARM 1-60 MIN COMPARISON:  Intraoperative film from earlier in the same day. FLUOROSCOPY TIME:  Radiation Exposure Index (as provided by the fluoroscopic device): 12.69 mGy If the device does not provide the exposure index: Fluoroscopy Time:  15 seconds Number of Acquired Images:  2 FINDINGS: Pedicle screws are noted at L5 and S1 with interbody fusion at L5-S1. Posterior fixation elements have not been placed. IMPRESSION: L5-S1 fusion. Electronically Signed   By: Alcide Clever M.D.   On: 06/14/2020 12:48   DG Lumbar Spine 1 View  Result Date: 06/14/2020 CLINICAL DATA:  Intraoperative localization. EXAM: LUMBAR SPINE - 1 VIEW COMPARISON:  Lumbar spine x-ray 05/23/2020 FINDINGS: Single cross-table lateral view of the lumbar spine at 0931 hours shows soft tissue retractors in the posterior lower back.  Numbering scheme used for this exam is the same as MRI of 02/15/2020. Radiopaque surgical probe is positioned with the tip overlying a location just posterior to the L5-S1 facets. Surgical sponge noted in the operative bed. IMPRESSION: Intraoperative localization. Electronically Signed   By: Kennith Center M.D.   On: 06/14/2020 11:16   DG C-Arm 1-60 Min  Result Date: 06/14/2020 CLINICAL DATA:  L5-S1 PLIF EXAM: LUMBAR SPINE - 2-3 VIEW; DG C-ARM 1-60 MIN COMPARISON:  Intraoperative film from earlier in the same day. FLUOROSCOPY TIME:  Radiation Exposure Index (as provided by the fluoroscopic device): 12.69 mGy If the device does not provide the exposure index: Fluoroscopy Time:  15 seconds Number of Acquired Images:  2 FINDINGS: Pedicle screws are noted at L5 and S1 with interbody fusion at L5-S1. Posterior fixation elements have not been placed. IMPRESSION: L5-S1 fusion. Electronically Signed   By: Alcide Clever M.D.   On: 06/14/2020 12:48    Assessment/Plan: Justin Schmitt underwent a one level posterior lumbar interbody fusion. He is recovering in the PACU and doing well. He will be admitted to [facility], where he will be mobilized and observed overnight. Discharge pending patient's progress.   LOS: 0 days     Val Eagle, DNP, AGNP-C Nurse Practitioner  [facility] [address], [place], [place] [address] P: [phone]    F: [phone]  06/14/2020, 1:16 PM

## 2020-06-14 NOTE — Anesthesia Procedure Notes (Signed)
Procedure Name: Intubation Date/Time: 06/14/2020 8:38 AM Performed by: Marena Chancy, CRNA Pre-anesthesia Checklist: Patient identified, Emergency Drugs available, Suction available and Patient being monitored Patient Re-evaluated:Patient Re-evaluated prior to induction Oxygen Delivery Method: Circle System Utilized Preoxygenation: Pre-oxygenation with 100% oxygen Induction Type: IV induction Ventilation: Mask ventilation without difficulty Laryngoscope Size: Miller and 2 Grade View: Grade I Tube type: Oral Tube size: 8.0 mm Number of attempts: 1 Airway Equipment and Method: Stylet and Oral airway Placement Confirmation: ETT inserted through vocal cords under direct vision,  positive ETCO2 and breath sounds checked- equal and bilateral Tube secured with: Tape Dental Injury: Teeth and Oropharynx as per pre-operative assessment

## 2020-06-14 NOTE — Transfer of Care (Signed)
Immediate Anesthesia Transfer of Care Note  Patient: Justin Schmitt  Procedure(s) Performed: POSTERIOR LUMBAR INTERBODY FUSION, INTERBODY PROSTHESIS, POSTERIOR INSTRUMENTATION, LUMBAR FIVE- SACRAL ONE (N/A Spine Lumbar)  Patient Location: PACU  Anesthesia Type:General  Level of Consciousness: awake, alert  and oriented  Airway & Oxygen Therapy: Patient Spontanous Breathing and Patient connected to nasal cannula oxygen  Post-op Assessment: Report given to RN, Post -op Vital signs reviewed and stable and Patient moving all extremities X 4  Post vital signs: Reviewed and stable  Last Vitals:  Vitals Value Taken Time  BP 127/68 06/14/20 1257  Temp 36.2 C 06/14/20 1255  Pulse 61 06/14/20 1300  Resp 18 06/14/20 1303  SpO2 100 % 06/14/20 1300  Vitals shown include unvalidated device data.  Last Pain:  Vitals:   06/14/20 1300  TempSrc:   PainSc: (P) 0-No pain      Patients Stated Pain Goal: 2 (06/14/20 0708)  Complications: No complications documented.

## 2020-06-14 NOTE — Anesthesia Preprocedure Evaluation (Signed)
Anesthesia Evaluation  Patient identified by MRN, date of birth, ID band Patient awake    Reviewed: Allergy & Precautions, NPO status , Patient's Chart, lab work & pertinent test results  Airway Mallampati: I  TM Distance: >3 FB Neck ROM: Full    Dental   Pulmonary    Pulmonary exam normal        Cardiovascular Normal cardiovascular exam     Neuro/Psych    GI/Hepatic   Endo/Other    Renal/GU      Musculoskeletal   Abdominal   Peds  Hematology   Anesthesia Other Findings   Reproductive/Obstetrics                             Anesthesia Physical Anesthesia Plan  ASA: II  Anesthesia Plan: General   Post-op Pain Management:    Induction:   PONV Risk Score and Plan: 2 and Ondansetron and Midazolam  Airway Management Planned: Oral ETT  Additional Equipment:   Intra-op Plan:   Post-operative Plan: Extubation in OR  Informed Consent: I have reviewed the patients History and Physical, chart, labs and discussed the procedure including the risks, benefits and alternatives for the proposed anesthesia with the patient or authorized representative who has indicated his/her understanding and acceptance.       Plan Discussed with: CRNA and Surgeon  Anesthesia Plan Comments:         Anesthesia Quick Evaluation

## 2020-06-14 NOTE — Evaluation (Signed)
Physical Therapy Evaluation Patient Details Name: Justin Schmitt MRN: [id number] DOB: 10-15-1981 Today's Date: 06/14/2020   History of Present Illness  Pt is a 39 y/o male s/p PLIF L5-S1. No additional PMH.  Clinical Impression  Pt presented sitting upright at EOB, awake and willing to participate in therapy session. Pt's spouse present throughout as well. Prior to admission, pt reported that he was independent with all functional mobility and ADLs. Pt lives with his wife and four children in a single level home with a couple of steps to enter. At the time of evaluation, pt overall moving very well without the need for physical assistance. He tolerated hallway ambulation without the need of an AD and participated in stair training without difficulties. PT provided pt education re: back precautions with handout provided, car transfers, wear schedule for LSO and a generalized walking program for pt to initiate upon d/c home. PT will continue to follow pt acutely to progress mobility as tolerated.    Follow Up Recommendations No PT follow up    Equipment Recommendations  None recommended by PT    Recommendations for Other Services       Precautions / Restrictions Precautions Precautions: Back Precaution Booklet Issued: Yes (comment) Precaution Comments: reviewed with pt throughout Required Braces or Orthoses: Spinal Brace Spinal Brace: Lumbar corset;Applied in sitting position Restrictions Weight Bearing Restrictions: No      Mobility  Bed Mobility               General bed mobility comments: pt seated EOB upon arrival  Transfers Overall transfer level: Needs assistance Equipment used: None Transfers: Sit to/from Stand Sit to Stand: Supervision         General transfer comment: for safety  Ambulation/Gait Ambulation/Gait assistance: Supervision Gait Distance (Feet): 500 Feet Assistive device: None Gait Pattern/deviations: Step-through pattern;Decreased stride  length Gait velocity: decreased   General Gait Details: pt with slow, cautious gait without need of UE supports or physical assistance, supervision for safety; no LOB   Stairs Stairs: Yes Stairs assistance: Min guard Stair Management: One rail Left;Step to pattern;Forwards Number of Stairs: 2 General stair comments: min guard for safety; no instability or LOB  Wheelchair Mobility    Modified Rankin (Stroke Patients Only)       Balance Overall balance assessment: No apparent balance deficits (not formally assessed)                                           Pertinent Vitals/Pain Pain Assessment: Faces Faces Pain Scale: Hurts a little bit Pain Location: incision site Pain Descriptors / Indicators: Sore Pain Intervention(s): Monitored during session;Repositioned    Home Living Family/patient expects to be discharged to:: Private residence Living Arrangements: Spouse/significant other;Children Available Help at Discharge: Family;Available 24 hours/day Type of Home: House Home Access: Stairs to enter Entrance Stairs-Rails: Right;Left Entrance Stairs-Number of Steps: 3. Home Layout: One level Home Equipment: Shower seat - built in      Prior Function Level of Independence: Independent               Hand Dominance        Extremity/Trunk Assessment   Upper Extremity Assessment Upper Extremity Assessment: Defer to OT evaluation;Overall [facility] for tasks assessed    Lower Extremity Assessment Lower Extremity Assessment: Overall WFL for tasks assessed    Cervical / Trunk Assessment Cervical / Trunk Assessment:  Other exceptions Cervical / Trunk Exceptions: s/p lumbar sx  Communication   Communication: No difficulties  Cognition Arousal/Alertness: Awake/alert Behavior During Therapy: WFL for tasks assessed/performed Overall Cognitive Status: Within Functional Limits for tasks assessed                                         General Comments      Exercises     Assessment/Plan    PT Assessment Patient needs continued PT services  PT Problem List Decreased mobility;Decreased knowledge of precautions;Pain       PT Treatment Interventions DME instruction;Gait training;Stair training;Therapeutic activities;Functional mobility training;Therapeutic exercise;Balance training;Neuromuscular re-education;Patient/family education    PT Goals (Current goals can be found in the Care Plan section)  Acute Rehab PT Goals Patient Stated Goal: "home tomorrow" PT Goal Formulation: With patient/family Time For Goal Achievement: 06/28/20 Potential to Achieve Goals: Good    Frequency Min 5X/week   Barriers to discharge        Co-evaluation               AM-PAC PT "6 Clicks" Mobility  Outcome Measure Help needed turning from your back to your side while in a flat bed without using bedrails?: None Help needed moving from lying on your back to sitting on the side of a flat bed without using bedrails?: None Help needed moving to and from a bed to a chair (including a wheelchair)?: None Help needed standing up from a chair using your arms (e.g., wheelchair or bedside chair)?: None Help needed to walk in hospital room?: None Help needed climbing 3-5 steps with a railing? : None 6 Click Score: 24    End of Session Equipment Utilized During Treatment: Back brace Activity Tolerance: Patient tolerated treatment well Patient left: with call bell/phone within reach;with family/visitor present;Other (comment) (seated EOB to eat dinner) Nurse Communication: Mobility status PT Visit Diagnosis: Other abnormalities of gait and mobility (R26.89);Pain Pain - part of body:  (back)    Time: 6967-8938 PT Time Calculation (min) (ACUTE ONLY): 15 min   Charges:   PT Evaluation $PT Eval Low Complexity: 1 Low          Ginette Pitman, PT, DPT  Acute Rehabilitation Services Pager [phone] Office  [phone]    Alessandra Bevels Jessyka Austria 06/14/2020, 4:46 PM

## 2020-06-14 NOTE — Op Note (Signed)
Brief history: The patient is a 39 year old white male who has complained of back and right greater left leg pain consistent with lumbosacral radiculopathy.  He has failed medical management.  He was worked up with lumbar x-rays and lumbar MRI which demonstrated an L5-S1 spondylolisthesis, L5 spondylolysis and lumbosacral foraminal stenosis.  I discussed the various treatment options with him.  He has weighed the risks, benefits and alternatives of surgery and decided proceed with an L5-S1 decompression, instrumentation and fusion.  Preoperative diagnosis: L5 spondylolysis, L5 spondylolisthesis degenerative disc disease, foraminal stenosis compressing bilateral L5 nerve roots nerve roots; lumbago; lumbar radiculopathy; neurogenic claudication  Postoperative diagnosis: The same  Procedure: L5 Gill procedure; laminotomy/foraminotomies/medial facetectomy to decompress the bilateral L5 and S1 nerve roots(the work required to do this was in addition to the work required to do the posterior lumbar interbody fusion because of the patient's spinal stenosis, facet arthropathy. Etc. requiring a wide decompression of the nerve roots.);  L5-S1 transforaminal lumbar interbody fusion with local morselized autograft bone and Zimmer DBM; insertion of interbody prosthesis at L5-S1 (globus peek expandable interbody prosthesis); posterior nonsegmental instrumentation from L5 to S1 with globus titanium pedicle screws and rods; posterior lateral arthrodesis at L5-S1 with local morselized autograft bone and Zimmer DBM.  Surgeon: Dr. Earle Gell  Asst.: Arnetha Massy, NP  Anesthesia: Gen. endotracheal  Estimated blood loss: 200 cc  Drains: None  Complications: None  Description of procedure: The patient was brought to the operating room by the anesthesia team. General endotracheal anesthesia was induced. The patient was turned to the prone position on the Wilson frame. The patient's lumbosacral region was then  prepared with Betadine scrub and Betadine solution. Sterile drapes were applied.  I then injected the area to be incised with Marcaine with epinephrine solution. I then used the scalpel to make a linear midline incision over the L5-S1 interspace. I then used electrocautery to perform a bilateral subperiosteal dissection exposing the spinous process and lamina of L5 and the upper sacrum. We then obtained intraoperative radiograph to confirm our location. We then inserted the Verstrac retractor to provide exposure.  I began the decompression by using the high speed drill to perform laminotomies at L5-S1 bilaterally.  We used the drill Kerrison punches and Leksell rongeur rongeur to perform a L5 Gill procedure. We then used the Kerrison punches to widen the laminotomy and removed the ligamentum flavum at L4-5 and L5-S1 bilaterally. We used the Kerrison punches to remove the medial facets at L5-S1 bilaterally. We performed wide foraminotomies about the bilateral L5 and S1 nerve roots completing the decompression.  We now turned our attention to the posterior lumbar interbody fusion. I used a scalpel to incise the intervertebral disc at L5-S1 bilaterally. I then performed a partial intervertebral discectomy at L5-S1 bilaterally using the pituitary forceps. We prepared the vertebral endplates at F7-P1 bilaterally for the fusion by removing the soft tissues with the curettes. We then used the trial spacers to pick the appropriate sized interbody prosthesis. We prefilled his prosthesis with a combination of local morselized autograft bone that we obtained during the decompression as well as Zimmer DBM. We inserted the prefilled prosthesis into the interspace at L5-S1 on the right, we then turned and expanded the prosthesis. There was a good snug fit of the prosthesis in the interspace. We then filled and the remainder of the intervertebral disc space with local morselized autograft bone and Zimmer DBM. This completed  the posterior lumbar interbody arthrodesis.  During the decompression and  insertion of the prosthesis the assistant protected the thecal sac and nerve roots with the D'Errico retractor.  We now turned attention to the instrumentation. Under fluoroscopic guidance we cannulated the bilateral L5 and S1 pedicles with the bone probe. We then removed the bone probe. We then tapped the pedicle with a 5.5 and 6.5 millimeter tap. We then removed the tap. We probed inside the tapped pedicle with a ball probe to rule out cortical breaches. We then inserted a 6.5 x 50 and 55 and 7.5 x 40 millimeter pedicle screw into the L5 and S1 pedicles bilaterally under fluoroscopic guidance.  The medial left L5 pedicle crack so I removed it.  We then palpated along the medial aspect of the pedicles to rule out cortical breaches. There were none. The nerve roots were not injured. We then connected the unilateral pedicle screws with a lordotic rod. We compressed the construct and secured the rod in place with the caps. We then tightened the caps appropriately. This completed the instrumentation from L5-S1 bilaterally.  We now turned our attention to the posterior lateral arthrodesis at L5-S1 bilaterally. We used the high-speed drill to decorticate the remainder of the facets, pars, transverse process at L5-S1 bilaterally. We then applied a combination of local morselized autograft bone and Zimmer DBM over these decorticated posterior lateral structures. This completed the posterior lateral arthrodesis.  We then obtained hemostasis using bipolar electrocautery. We irrigated the wound out with bacitracin solution. We inspected the thecal sac and nerve roots and noted they were well decompressed. We then removed the retractor.  We injected Exparel . We reapproximated patient's thoracolumbar fascia with interrupted #1 Vicryl suture. We reapproximated patient's subcutaneous tissue with interrupted 2-0 Vicryl suture. The reapproximated  patient's skin with Steri-Strips and benzoin. The wound was then coated with bacitracin ointment. A sterile dressing was applied. The drapes were removed. The patient was subsequently returned to the supine position where they were extubated by the anesthesia team. He was then transported to the post anesthesia care unit in stable condition. All sponge instrument and needle counts were reportedly correct at the end of this case.

## 2020-06-15 LAB — CBC
HCT: 41.9 % (ref 39.0–52.0)
Hemoglobin: 14 g/dL (ref 13.0–17.0)
MCH: 30.1 pg (ref 26.0–34.0)
MCHC: 33.4 g/dL (ref 30.0–36.0)
MCV: 90.1 fL (ref 80.0–100.0)
Platelets: 225 10*3/uL (ref 150–400)
RBC: 4.65 MIL/uL (ref 4.22–5.81)
RDW: 12.9 % (ref 11.5–15.5)
WBC: 9.8 10*3/uL (ref 4.0–10.5)
nRBC: 0 % (ref 0.0–0.2)

## 2020-06-15 LAB — BASIC METABOLIC PANEL
Anion gap: 11 (ref 5–15)
BUN: 12 mg/dL (ref 6–20)
CO2: 26 mmol/L (ref 22–32)
Calcium: 9.5 mg/dL (ref 8.9–10.3)
Chloride: 102 mmol/L (ref 98–111)
Creatinine, Ser: 0.97 mg/dL (ref 0.61–1.24)
GFR calc Af Amer: >60 mL/min (ref >60)
GFR calc non Af Amer: >60 mL/min (ref >60)
Glucose, Bld: 116 mg/dL — ABNORMAL HIGH (ref 70–99)
Potassium: 4.1 mmol/L (ref 3.5–5.1)
Sodium: 139 mmol/L (ref 135–145)

## 2020-06-15 MED ORDER — OXYCODONE HCL 5 MG PO TABS: 5.0000 mg | ORAL_TABLET | ORAL | 0 refills | Status: AC | PRN

## 2020-06-15 MED ORDER — DOCUSATE SODIUM 100 MG PO CAPS: 100.0000 mg | ORAL_CAPSULE | Freq: Two times a day (BID) | ORAL | 0 refills | Status: AC

## 2020-06-15 MED ORDER — CYCLOBENZAPRINE HCL 10 MG PO TABS: 10.0000 mg | ORAL_TABLET | Freq: Three times a day (TID) | ORAL | 0 refills | Status: AC | PRN

## 2020-06-15 MED ORDER — ACETAMINOPHEN 500 MG PO TABS: 1000.0000 mg | ORAL_TABLET | Freq: Four times a day (QID) | ORAL | 0 refills | Status: AC

## 2020-06-15 MED FILL — Thrombin For Soln 5000 Unit: CUTANEOUS | Qty: 5000 | Status: AC

## 2020-06-15 NOTE — Progress Notes (Signed)
Physical Therapy Treatment Patient Details Name: Justin Schmitt MRN: [id number] DOB: 1981-07-05 Today's Date: 06/15/2020    History of Present Illness Pt is a 39 y/o male s/p PLIF L5-S1. No additional PMH.    PT Comments    Pt progressing well with post-op mobility. He was able to demonstrate transfers and ambulation with gross modified independence and no AD. Pt was educated on precautions, brace application/wearing schedule, appropriate activity progression, and car transfer. Will continue to follow.      Follow Up Recommendations  No PT follow up     Equipment Recommendations  None recommended by PT    Recommendations for Other Services       Precautions / Restrictions Precautions Precautions: Back Precaution Booklet Issued: Yes (comment) Precaution Comments: reviewed with pt throughout Required Braces or Orthoses: Spinal Brace Spinal Brace: Lumbar corset;Applied in sitting position Restrictions Weight Bearing Restrictions: No    Mobility  Bed Mobility               General bed mobility comments: Pt was received ambulating in the hall  Transfers Overall transfer level: Modified independent Equipment used: None Transfers: Sit to/from Stand           General transfer comment: for safety  Ambulation/Gait Ambulation/Gait assistance: Modified independent (Device/Increase time) Gait Distance (Feet): 500 Feet Assistive device: None Gait Pattern/deviations: Step-through pattern;Decreased stride length Gait velocity: decreased   General Gait Details: Slow but generally steady without UE support.    Stairs Stairs: Yes Stairs assistance: Modified independent (Device/Increase time) Stair Management: One rail Left;Forwards;Alternating pattern Number of Stairs: 10 General stair comments: no instability or LOB   Wheelchair Mobility    Modified Rankin (Stroke Patients Only)       Balance Overall balance assessment: No apparent balance deficits (not  formally assessed)                                          Cognition Arousal/Alertness: Awake/alert Behavior During Therapy: WFL for tasks assessed/performed Overall Cognitive Status: Within Functional Limits for tasks assessed                                        Exercises      General Comments        Pertinent Vitals/Pain Pain Assessment: Faces Faces Pain Scale: No hurt Pain Location: incision site Pain Descriptors / Indicators: Sore Pain Intervention(s): Limited activity within patient's tolerance;Monitored during session;Repositioned    Home Living                      Prior Function            PT Goals (current goals can now be found in the care plan section) Acute Rehab PT Goals Patient Stated Goal: To return home.  PT Goal Formulation: With patient Time For Goal Achievement: 06/28/20 Potential to Achieve Goals: Good Progress towards PT goals: Progressing toward goals    Frequency    Min 5X/week      PT Plan Current plan remains appropriate    Co-evaluation              AM-PAC PT "6 Clicks" Mobility   Outcome Measure  Help needed turning from your back to your side while in a flat bed without  using bedrails?: None Help needed moving from lying on your back to sitting on the side of a flat bed without using bedrails?: None Help needed moving to and from a bed to a chair (including a wheelchair)?: None Help needed standing up from a chair using your arms (e.g., wheelchair or bedside chair)?: None Help needed to walk in hospital room?: None Help needed climbing 3-5 steps with a railing? : None 6 Click Score: 24    End of Session Equipment Utilized During Treatment: Back brace Activity Tolerance: Patient tolerated treatment well Patient left: Other (comment) (ambulating in room) Nurse Communication: Mobility status PT Visit Diagnosis: Other abnormalities of gait and mobility (R26.89);Pain Pain -  part of body:  (back)     Time: 3664-4034 PT Time Calculation (min) (ACUTE ONLY): 11 min  Charges:  $Gait Training: 8-22 mins                     Rolinda Roan, PT, DPT Acute Rehabilitation Services Pager: [phone] Office: [phone]    Thelma Comp 06/15/2020, 1:10 PM

## 2020-06-15 NOTE — Discharge Summary (Addendum)
Physician Discharge Summary     Providing Compassionate, Quality Care - Together   Patient ID: Justin Schmitt MRN: [id number] DOB/AGE: [birth year] 39 y.o.  Admit date: 06/14/2020 Discharge date: 06/15/2020  Admission Diagnoses: Spondylolisthesis of lumbosacral region  Discharge Diagnoses:  Active Problems:   Spondylolisthesis of lumbosacral region   Discharged Condition: good  Hospital Course: Patient underwent a one level fusion by Dr. Lovell Sheehan on 06/12/2020. He was admitted to 3C11 overnight for observation. His postoperative course has been uncomplicated. He has worked with both physical and occupational therapies. He is ambulating independently and without difficulty. He is tolerating a normal diet. He is not having any bowel or bladder dysfunction. His pain is well-controlled with oral pain medication. He is ready for discharge home.   Consults: rehabilitation medicine  Significant Diagnostic Studies: radiology: DG Lumbar Spine 2-3 Views  Result Date: 06/14/2020 CLINICAL DATA:  L5-S1 PLIF EXAM: LUMBAR SPINE - 2-3 VIEW; DG C-ARM 1-60 MIN COMPARISON:  Intraoperative film from earlier in the same day. FLUOROSCOPY TIME:  Radiation Exposure Index (as provided by the fluoroscopic device): 12.69 mGy If the device does not provide the exposure index: Fluoroscopy Time:  15 seconds Number of Acquired Images:  2 FINDINGS: Pedicle screws are noted at L5 and S1 with interbody fusion at L5-S1. Posterior fixation elements have not been placed. IMPRESSION: L5-S1 fusion. Electronically Signed   By: Alcide Clever M.D.   On: 06/14/2020 12:48   DG Lumbar Spine 1 View  Result Date: 06/14/2020 CLINICAL DATA:  Intraoperative localization. EXAM: LUMBAR SPINE - 1 VIEW COMPARISON:  Lumbar spine x-ray 05/23/2020 FINDINGS: Single cross-table lateral view of the lumbar spine at 0931 hours shows soft tissue retractors in the posterior lower back. Numbering scheme used for this exam is the same as MRI of  02/15/2020. Radiopaque surgical probe is positioned with the tip overlying a location just posterior to the L5-S1 facets. Surgical sponge noted in the operative bed. IMPRESSION: Intraoperative localization. Electronically Signed   By: Kennith Center M.D.   On: 06/14/2020 11:16   DG C-Arm 1-60 Min  Result Date: 06/14/2020 CLINICAL DATA:  L5-S1 PLIF EXAM: LUMBAR SPINE - 2-3 VIEW; DG C-ARM 1-60 MIN COMPARISON:  Intraoperative film from earlier in the same day. FLUOROSCOPY TIME:  Radiation Exposure Index (as provided by the fluoroscopic device): 12.69 mGy If the device does not provide the exposure index: Fluoroscopy Time:  15 seconds Number of Acquired Images:  2 FINDINGS: Pedicle screws are noted at L5 and S1 with interbody fusion at L5-S1. Posterior fixation elements have not been placed. IMPRESSION: L5-S1 fusion. Electronically Signed   By: Alcide Clever M.D.   On: 06/14/2020 12:48     Treatments: surgery: L5 Gill procedure; laminotomy/foraminotomies/medial facetectomy to decompress the bilateral L5 and S1 nerve roots(the work required to do this was in addition to the work required to do the posterior lumbar interbody fusion because of the patient's spinal stenosis, facet arthropathy. Etc. requiring a wide decompression of the nerve roots.);  L5-S1 transforaminal lumbar interbody fusion with local morselized autograft bone and Zimmer DBM; insertion of interbody prosthesis at L5-S1 (globus peek expandable interbody prosthesis); posterior nonsegmental instrumentation from L5 to S1 with globus titanium pedicle screws and rods; posterior lateral arthrodesis at L5-S1 with local morselized autograft bone and Zimmer DBM.  Discharge Exam: Blood pressure 122/65, pulse 67, temperature 98.4 F (36.9 C), temperature source Oral, resp. rate 16, height 6' (1.829 m), weight 124.7 kg, SpO2 100 %.   Alert and oriented x  4 PERRLA CN II-XII grossly intact MAE, Strength and sensation intact Incision is covered with  Honeycomb dressing and Steri Strips; Dressing is clean, dry, and intact   Disposition: Discharge disposition: 01-Home or Self Care         Allergies as of 06/15/2020       Reactions   Nsaids Swelling   Eyelid swelling        Medication List     TAKE these medications    acetaminophen 500 MG tablet Commonly known as: TYLENOL Take 2 tablets (1,000 mg total) by mouth every 6 (six) hours.   albuterol 108 (90 Base) MCG/ACT inhaler Commonly known as: VENTOLIN HFA Inhale 1-2 puffs into the lungs every 6 (six) hours as needed for wheezing or shortness of breath.   cyclobenzaprine 10 MG tablet Commonly known as: FLEXERIL Take 1 tablet (10 mg total) by mouth 3 (three) times daily as needed for muscle spasms.   docusate sodium 100 MG capsule Commonly known as: COLACE Take 1 capsule (100 mg total) by mouth 2 (two) times daily.   oxyCODONE 5 MG immediate release tablet Commonly known as: Oxy IR/ROXICODONE Take 1-2 tablets (5-10 mg total) by mouth every 4 (four) hours as needed for moderate pain ((score 4 to 6)).        Follow-up Information     Newman Pies, MD Follow up.   Specialty: Neurosurgery Contact information: [address] [place] [address] [phone]                 Signed: Patricia Nettle 06/15/2020, 10:22 AM

## 2020-06-15 NOTE — Progress Notes (Signed)
Pt doing well. Pt and wife given D/C instructions with verbal understanding. Rx's were sent to the pharmacy by MD. Pt's incision is clean and dry with no sign of infection. Pt's IV was removed prior to D/C. Pt D/C'd home via wheelchair per MD order. Pt is stable @ D/C and has no other needs at this time. Meziah Blasingame, RN  

## 2020-06-15 NOTE — Discharge Instructions (Signed)
Wound Care Keep incision covered and dry for two days.    Do not put any creams, lotions, or ointments on incision. Leave steri-strips on back.  They will fall off by themselves. Activity Walk each and every day, increasing distance each day. No lifting greater than 5 lbs. No driving for 2 weeks; may ride as a passenger locally. Diet Resume your normal diet.  Return to Work Will be discussed at your follow up appointment. Call Your Doctor If Any of These Occur Redness, drainage, or swelling at the wound.  Temperature greater than 101 degrees. Severe pain not relieved by pain medication. Incision starts to come apart. Follow Up Appt Call today for appointment in 2 weeks [phone]) or for problems.  If you have any hardware placed in your spine, you will need an x-ray before your appointment.

## 2020-06-15 NOTE — Evaluation (Signed)
Occupational Therapy Evaluation Patient Details Name: Justin Schmitt MRN: [id number] DOB: 10-20-1981 Today's Date: 06/15/2020    History of Present Illness Pt is a 39 y/o male s/p PLIF L5-S1. No additional PMH.   Clinical Impression   Prior to hospital admission, patient was living with his wife in a private residence and was independent with BADLs/IADLs without assistive device. Patient currently presents near below baseline level of function demonstrating Mod I for toilet transfers and UB BADLs. Min A grossly for LB BADLs including bathing/dressing. OT provided written and verbal education on back precautions with patient able to verbalize 3/3 independently. Education also provided on use and acquisition of AE to maximize independence with LB BADLs, and safety with functional transfers. Patient expressed verbal understanding. Patient does not demonstrate need for acute OT services. No follow-up recommendations for post acute rehab at this time.    Follow Up Recommendations  No OT follow up;Supervision/Assistance - 24 hour    Equipment Recommendations  None recommended by OT    Recommendations for Other Services       Precautions / Restrictions Precautions Precautions: Back Precaution Booklet Issued: Yes (comment) Precaution Comments: reviewed with pt throughout Required Braces or Orthoses: Spinal Brace Spinal Brace: Lumbar corset;Applied in sitting position Restrictions Weight Bearing Restrictions: No      Mobility Bed Mobility                  Transfers Overall transfer level: Needs assistance Equipment used: None Transfers: Sit to/from Stand Sit to Stand: Supervision         General transfer comment: for safety    Balance Overall balance assessment: No apparent balance deficits (not formally assessed)                                         ADL either performed or assessed with clinical judgement   ADL Overall ADL's : Needs  assistance/impaired     Grooming: Wash/dry hands;Wash/dry face;Modified independent           Upper Body Dressing : Modified independent   Lower Body Dressing: Minimal assistance;Adhering to back precautions   Toilet Transfer: Modified Independent;Regular Toilet;Grab bars Toilet Transfer Details (indicate cue type and reason): Increased time and use of grab bar                 Vision Baseline Vision/History: No visual deficits Patient Visual Report: No change from baseline Vision Assessment?: No apparent visual deficits     Perception     Praxis      Pertinent Vitals/Pain Faces Pain Scale: No hurt     Hand Dominance     Extremity/Trunk Assessment Upper Extremity Assessment Upper Extremity Assessment: Overall WFL for tasks assessed   Lower Extremity Assessment Lower Extremity Assessment: Defer to PT evaluation   Cervical / Trunk Assessment Cervical / Trunk Assessment: Other exceptions Cervical / Trunk Exceptions: s/p lumbar sx   Communication Communication Communication: No difficulties   Cognition Arousal/Alertness: Awake/alert Behavior During Therapy: WFL for tasks assessed/performed Overall Cognitive Status: Within Functional Limits for tasks assessed                                     General Comments       Exercises     Shoulder Instructions      Home  Living Family/patient expects to be discharged to:: Private residence Living Arrangements: Spouse/significant other;Children Available Help at Discharge: Family;Available 24 hours/day Type of Home: House Home Access: Stairs to enter [employer] of Steps: 3. Entrance Stairs-Rails: Right;Left Home Layout: One level     Bathroom Shower/Tub: Chief Strategy Officer: Shower seat - built in          Prior Functioning/Environment Level of Independence: Independent                 OT Problem List:        OT Treatment/Interventions:       OT Goals(Current goals can be found in the care plan section) Acute Rehab OT Goals Patient Stated Goal: To return home.   OT Frequency:     Barriers to D/C:            Co-evaluation              AM-PAC OT "6 Clicks" Daily Activity     Outcome Measure Help from another person eating meals?: None Help from another person taking care of personal grooming?: None Help from another person toileting, which includes using toliet, bedpan, or urinal?: A Little Help from another person bathing (including washing, rinsing, drying)?: A Little Help from another person to put on and taking off regular upper body clothing?: None Help from another person to put on and taking off regular lower body clothing?: A Little 6 Click Score: 21   End of Session Equipment Utilized During Treatment: Gait belt  Activity Tolerance: Patient tolerated treatment well Patient left: in bed;with call bell/phone within reach  OT Visit Diagnosis: Other abnormalities of gait and mobility (R26.89)                Time: 9562-1308 OT Time Calculation (min): 12 min Charges:  OT General Charges $OT Visit: 1 Visit OT Evaluation $OT Eval Low Complexity: 1 Low  Gabryel Files H. OTR/L Supplemental OT, Department of rehab services [phone]  Tranesha Lessner R H.  06/15/2020, 10:03 AM

## 2020-06-21 MED FILL — Sodium Chloride IV Soln 0.9%: INTRAVENOUS | Qty: 1000 | Status: AC

## 2020-06-21 MED FILL — Heparin Sodium (Porcine) Inj 1000 Unit/ML: INTRAMUSCULAR | Qty: 30 | Status: AC

## 2020-07-04 DIAGNOSIS — M4317 Spondylolisthesis, lumbosacral region: Secondary | ICD-10-CM | POA: Diagnosis not present

## 2020-07-25 DIAGNOSIS — R1031 Right lower quadrant pain: Secondary | ICD-10-CM | POA: Diagnosis not present

## 2020-07-25 DIAGNOSIS — R1032 Left lower quadrant pain: Secondary | ICD-10-CM | POA: Diagnosis not present

## 2020-08-01 DIAGNOSIS — M25551 Pain in right hip: Secondary | ICD-10-CM | POA: Diagnosis not present

## 2020-08-07 DIAGNOSIS — M25551 Pain in right hip: Secondary | ICD-10-CM | POA: Diagnosis not present

## 2020-10-17 DIAGNOSIS — Z981 Arthrodesis status: Secondary | ICD-10-CM | POA: Diagnosis not present

## 2020-10-17 DIAGNOSIS — M4317 Spondylolisthesis, lumbosacral region: Secondary | ICD-10-CM | POA: Diagnosis not present

## 2020-10-17 DIAGNOSIS — R030 Elevated blood-pressure reading, without diagnosis of hypertension: Secondary | ICD-10-CM | POA: Diagnosis not present

## 2020-10-17 DIAGNOSIS — Z6836 Body mass index (BMI) 36.0-36.9, adult: Secondary | ICD-10-CM | POA: Diagnosis not present

## 2021-01-22 DIAGNOSIS — Z6835 Body mass index (BMI) 35.0-35.9, adult: Secondary | ICD-10-CM | POA: Diagnosis not present

## 2021-01-22 DIAGNOSIS — R030 Elevated blood-pressure reading, without diagnosis of hypertension: Secondary | ICD-10-CM | POA: Diagnosis not present

## 2021-01-22 DIAGNOSIS — M4317 Spondylolisthesis, lumbosacral region: Secondary | ICD-10-CM | POA: Diagnosis not present

## 2021-04-08 IMAGING — RF DG LUMBAR SPINE 2-3V
1 series · 2 of 2 positions shown · non-contrast
Comparison: Intraoperative film from earlier in the same day.

CLINICAL DATA: L5-S1 PLIF

EXAM:
LUMBAR SPINE - 2-3 VIEW; DG C-ARM 1-60 MIN

[Series 1: run · 2 of 2 slices shown]
[im 1/2]
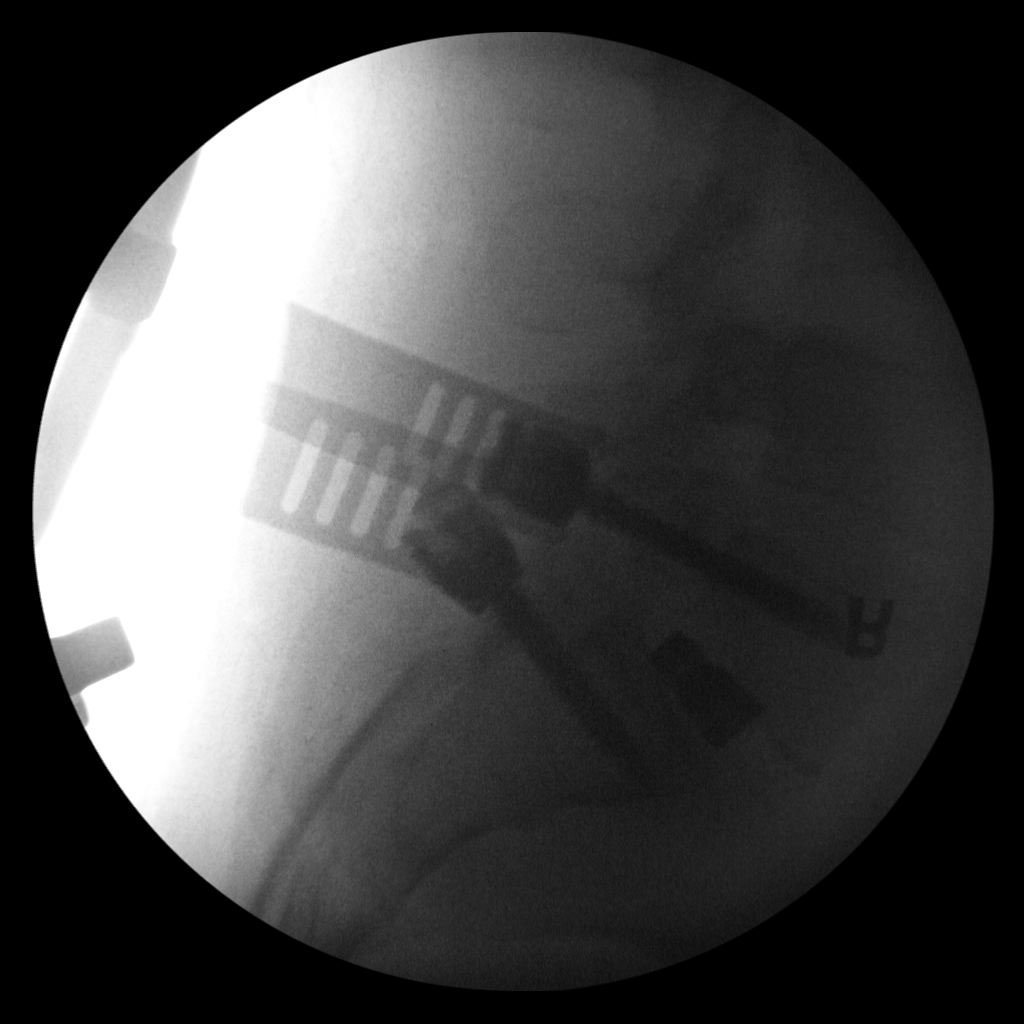
[im 2/2]
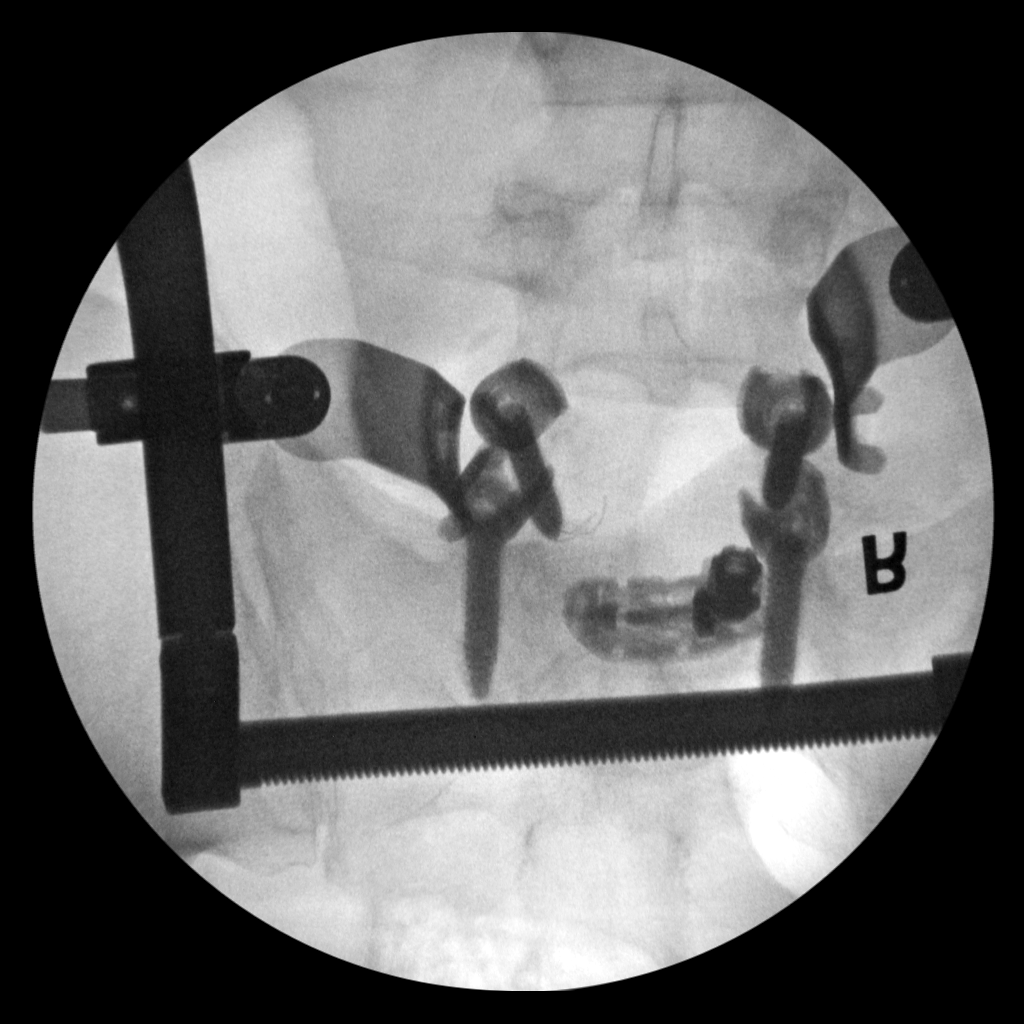

[2 of 2 positions shown; findings below may reference images not displayed]

FLUOROSCOPY TIME:  Radiation Exposure Index (as provided by the
fluoroscopic device): 12.69 mGy

If the device does not provide the exposure index:

Fluoroscopy Time:  15 seconds

Number of Acquired Images:  2
FINDINGS: Pedicle screws are noted at L5 and S1 with interbody fusion at
L5-S1. Posterior fixation elements have not been placed.
IMPRESSION: L5-S1 fusion.
